# Patient Record
Sex: Female | Born: 2000 | Race: Black or African American | Hispanic: No | Marital: Single | Smoking: Never smoker
Health system: Southern US, Community
[De-identification: ages and names within clinical notes are randomized; demographics above are authoritative.]

## PROBLEM LIST (undated history)

## (undated) DIAGNOSIS — M1990 Unspecified osteoarthritis, unspecified site: Secondary | ICD-10-CM

---
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
## 2020-04-14 ENCOUNTER — Other Ambulatory Visit: Payer: Self-pay

## 2020-04-14 ENCOUNTER — Ambulatory Visit
Admission: EM | Admit: 2020-04-14 | Discharge: 2020-04-14 | Disposition: A | Discharge status: Home / Self Care | Payer: Medicaid Other | Attending: Emergency Medicine | Admitting: Emergency Medicine

## 2020-04-14 ENCOUNTER — Encounter: Payer: Self-pay | Admitting: Emergency Medicine

## 2020-04-14 DIAGNOSIS — N3001 Acute cystitis with hematuria: Secondary | ICD-10-CM | POA: Diagnosis not present

## 2020-04-14 LAB — POCT URINALYSIS DIP (MANUAL ENTRY)
Bilirubin, UA: NEGATIVE
Glucose, UA: NEGATIVE mg/dL
Ketones, POC UA: NEGATIVE mg/dL
Nitrite, UA: NEGATIVE
Protein Ur, POC: 100 mg/dL — AB
Spec Grav, UA: >=1.03 — AB (ref 1.010–1.025)
Urobilinogen, UA: 1 E.U./dL
pH, UA: 6 (ref 5.0–8.0)

## 2020-04-14 LAB — POCT URINE PREGNANCY: Preg Test, Ur: NEGATIVE

## 2020-04-14 MED ORDER — CEPHALEXIN 500 MG PO CAPS: 500.0000 mg | ORAL_CAPSULE | Freq: Two times a day (BID) | ORAL | 0 refills | Status: AC

## 2020-04-14 NOTE — Discharge Instructions (Addendum)
Take antibiotic twice daily with food. Important to drink plenty of water throughout the day. Return for worsening urinary symptoms, blood in urine, abdominal or back pain, fever. 

## 2020-04-14 NOTE — ED Provider Notes (Signed)
[facility]    CSN: [id number] Arrival date & time: 04/14/20  1425      History   Chief Complaint Chief Complaint  Patient presents with  . Dysuria    HPI Dana Marquez is a 19 y.o. female  Presenting for possible UTI.  Noting burning sensation with malodor when urinating for the last 3 days.  Denies abdominal or back pain, fever.  No vaginal discharge, pelvic pain, concern for STI.  Denies any genital rash, change in bowel movement.  Not tried thing for this.  History reviewed. No pertinent past medical history.  There are no problems to display for this patient.   Past Surgical History:  Procedure Laterality Date  . TONSILLECTOMY  2012    OB History    Gravida  0   Para  0   Term  0   Preterm  0   AB  0   Living  0     SAB  0   TAB  0   Ectopic  0   Multiple  0   Live Births  0            Home Medications    Prior to Admission medications   Medication Sig Start Date End Date Taking? Authorizing Provider  acetaminophen-codeine 120-12 MG/5ML solution Take 5 mLs by mouth every 4 (four) hours as needed. For pain    [provider]  cephALEXin (KEFLEX) 500 MG capsule Take 1 capsule (500 mg total) by mouth 2 (two) times daily for 3 days. 04/14/20 04/17/20  Hall-Potvin, [place], PA-C  guaiFENesin (MUCINEX) 600 MG 12 hr tablet Take 1 tablet (600 mg total) by mouth 2 (two) times daily. Patient not taking: Reported on 09/15/2018 05/15/18   Garlon Hatchet, PA-C  ibuprofen (ADVIL) 800 MG tablet Take 1 tablet (800 mg total) by mouth every 8 (eight) hours as needed. 05/23/19   Brock Bad, MD  lidocaine (XYLOCAINE) 2 % solution Use as directed 15 mLs in the mouth or throat as needed for mouth pain. Patient not taking: Reported on 09/15/2018 05/15/18   Garlon Hatchet, PA-C  medroxyPROGESTERone (DEPO-PROVERA) 150 MG/ML injection Inject 1 mL (150 mg total) into the muscle every 3 (three) months. 05/23/19   Brock Bad, MD   polyethylene glycol powder (GLYCOLAX/MIRALAX) powder 1 capful mixed in 6 oz pear juice once daily for 1 week Patient not taking: Reported on 06/04/2017 03/01/12   Ree Shay, MD  Prenat w/o A-FeCbn-Meth-FA-DHA (PRENATE MINI) 29-0.6-0.4-350 MG CAPS Take 1 capsule by mouth daily before breakfast. 05/23/19   Brock Bad, MD  promethazine (PHENERGAN) 25 MG tablet Take 1 tablet (25 mg total) by mouth every 6 (six) hours as needed for nausea or vomiting. Patient not taking: Reported on 06/04/2017 12/31/15   Linna Hoff, MD  tinidazole [facility]) 500 MG tablet Take 2 tablets (1,000 mg total) by mouth daily with breakfast. 05/25/19   Brock Bad, MD    Family History Family History  Problem Relation Age of Onset  . Diabetes type II Maternal Grandmother   . Hypertension Maternal Grandmother     Social History Social History   Tobacco Use  . Smoking status: Never Smoker  . Smokeless tobacco: Never Used  Vaping Use  . Vaping Use: Never used  Substance Use Topics  . Alcohol use: No  . Drug use: No     Allergies   Patient has no known allergies.   Review of Systems As  per HPI   Physical Exam Triage Vital Signs ED Triage Vitals [04/14/20 1449]  Enc Vitals Group     BP 133/90     Pulse Rate 88     Resp 18     Temp 98.6 F (37 C)     Temp Source Oral     SpO2 98 %     Weight      Height      Head Circumference      Peak Flow      Pain Score 4     Pain Loc      Pain Edu?      Excl. in GC?    No data found.  Updated Vital Signs BP 133/90 (BP Location: Right Arm)   Pulse 88   Temp 98.6 F (37 C) (Oral)   Resp 18   SpO2 98%   Visual Acuity Right Eye Distance:   Left Eye Distance:   Bilateral Distance:    Right Eye Near:   Left Eye Near:    Bilateral Near:     Physical Exam Constitutional:      General: She is not in acute distress. HENT:     Head: Normocephalic and atraumatic.  Eyes:     General: No scleral icterus.    Pupils: Pupils are  equal, round, and reactive to light.  Cardiovascular:     Rate and Rhythm: Normal rate.  Pulmonary:     Effort: Pulmonary effort is normal.  Abdominal:     General: Bowel sounds are normal.     Palpations: Abdomen is soft.     Tenderness: There is no abdominal tenderness. There is no right CVA tenderness, left CVA tenderness or guarding.  Skin:    Coloration: Skin is not jaundiced or pale.  Neurological:     Mental Status: She is alert and oriented to person, place, and time.      UC Treatments / Results  Labs (all labs ordered are listed, but only abnormal results are displayed) Labs Reviewed  POCT URINALYSIS DIP (MANUAL ENTRY) - Abnormal; Notable for the following components:      Result Value   Clarity, UA cloudy (*)    Spec Grav, UA >=1.030 (*)    Blood, UA small (*)    Protein Ur, POC =100 (*)    Leukocytes, UA Trace (*)    All other components within normal limits  URINE CULTURE  POCT URINE PREGNANCY    EKG   Radiology No results found.  Procedures Procedures (including critical care time)  Medications Ordered in UC Medications - No data to display  Initial Impression / Assessment and Plan / UC Course  I have reviewed the triage vital signs and the nursing notes.  Pertinent labs & imaging results that were available during my care of the patient were reviewed by me and considered in my medical decision making (see chart for details).     Urine negative, urine dip with abnormalities as above.  Will start antibiotics as below for UTI.  Return precautions discussed, pt verbalized understanding and is agreeable to plan. Final Clinical Impressions(s) / UC Diagnoses   Final diagnoses:  Acute cystitis with hematuria     Discharge Instructions     Take antibiotic twice daily with food. Important to drink plenty of water throughout the day. Return for worsening urinary symptoms, blood in urine, abdominal or back pain, fever.    ED Prescriptions     Medication Sig Dispense Auth. Provider  cephALEXin (KEFLEX) 500 MG capsule Take 1 capsule (500 mg total) by mouth 2 (two) times daily for 3 days. 6 capsule Hall-Potvin, [place], PA-C     PDMP not reviewed this encounter.   Hall-Potvin, [place], [place] 04/14/20 1536

## 2020-04-14 NOTE — ED Triage Notes (Signed)
Pt here for dysuria and foul odor x 3 days

## 2020-04-20 LAB — URINE CULTURE: Culture: >=100000 — AB

## 2020-05-24 ENCOUNTER — Other Ambulatory Visit: Payer: Self-pay

## 2020-05-24 ENCOUNTER — Encounter: Payer: Self-pay | Admitting: Obstetrics

## 2020-05-24 ENCOUNTER — Ambulatory Visit (INDEPENDENT_AMBULATORY_CARE_PROVIDER_SITE_OTHER): Payer: Medicaid Other | Admitting: Obstetrics

## 2020-05-24 ENCOUNTER — Other Ambulatory Visit (HOSPITAL_COMMUNITY)
Admission: RE | Admit: 2020-05-24 | Discharge: 2020-05-24 | Disposition: A | Discharge status: Home / Self Care | Payer: Medicaid Other | Source: Ambulatory Visit | Attending: Obstetrics | Admitting: Obstetrics

## 2020-05-24 VITALS — BP 108/70 | HR 58 | Ht 63.0 in | Wt 100.0 lb

## 2020-05-24 DIAGNOSIS — N898 Other specified noninflammatory disorders of vagina: Secondary | ICD-10-CM

## 2020-05-24 DIAGNOSIS — Z113 Encounter for screening for infections with a predominantly sexual mode of transmission: Secondary | ICD-10-CM

## 2020-05-24 DIAGNOSIS — Z3042 Encounter for surveillance of injectable contraceptive: Secondary | ICD-10-CM

## 2020-05-24 LAB — POCT URINE PREGNANCY: Preg Test, Ur: NEGATIVE

## 2020-05-24 MED ORDER — MEDROXYPROGESTERONE ACETATE 150 MG/ML IM SUSP: 150.0000 mg | INTRAMUSCULAR | 3 refills | Status: DC

## 2020-05-24 NOTE — Progress Notes (Signed)
GYN presents for AEX/STD screening.  Patient wants to restart DEPO for [facility]. Last unproctected sex was last week.  Reports no problems today.  UPT today is NEGATIVE

## 2020-05-24 NOTE — Progress Notes (Signed)
Subjective:        Dana Marquez is a 19 y.o. female here for a routine exam.  Current complaints: Vaginal discharge.    Personal health questionnaire:  Is patient [name], have a family history of breast and/or ovarian cancer: no Is there a family history of uterine cancer diagnosed at age < 62, gastrointestinal cancer, urinary tract cancer, family member who is a Personnel officer syndrome-associated carrier: no Is the patient overweight and hypertensive, family history of diabetes, personal history of gestational diabetes, preeclampsia or PCOS: no Is patient over 77, have PCOS,  family history of premature CHD under age 68, diabetes, smoke, have hypertension or peripheral artery disease:  no At any time, has a partner hit, kicked or otherwise hurt or frightened you?: no Over the past 2 weeks, have you felt down, depressed or hopeless?: no Over the past 2 weeks, have you felt little interest or pleasure in doing things?:no   Gynecologic History Patient's last menstrual period was 05/10/2020 (exact date). Contraception: Depo-Provera injections Last Pap: n/a. Results were: n/a Last mammogram: n/a. Results were: n/a  Obstetric History OB History  Gravida Para Term Preterm AB Living  0 0 0 0 0 0  SAB TAB Ectopic Multiple Live Births  0 0 0 0 0    History reviewed. No pertinent past medical history.  Past Surgical History:  Procedure Laterality Date  . TONSILLECTOMY  2012     Current Outpatient Medications:  .  acetaminophen-codeine 120-12 MG/5ML solution, Take 5 mLs by mouth every 4 (four) hours as needed. For pain, Disp: , Rfl:  .  guaiFENesin (MUCINEX) 600 MG 12 hr tablet, Take 1 tablet (600 mg total) by mouth 2 (two) times daily. (Patient not taking: Reported on 09/15/2018), Disp: 30 tablet, Rfl: 0 .  ibuprofen (ADVIL) 800 MG tablet, Take 1 tablet (800 mg total) by mouth every 8 (eight) hours as needed., Disp: 30 tablet, Rfl: 5 .  lidocaine (XYLOCAINE) 2 % solution, Use  as directed 15 mLs in the mouth or throat as needed for mouth pain. (Patient not taking: Reported on 09/15/2018), Disp: 150 mL, Rfl: 0 .  medroxyPROGESTERone (DEPO-PROVERA) 150 MG/ML injection, Inject 1 mL (150 mg total) into the muscle every 3 (three) months., Disp: 1 mL, Rfl: 3 .  polyethylene glycol powder (GLYCOLAX/MIRALAX) powder, 1 capful mixed in 6 oz pear juice once daily for 1 week (Patient not taking: Reported on 06/04/2017), Disp: 255 g, Rfl: 0 .  Prenat w/o A-FeCbn-Meth-FA-DHA (PRENATE MINI) 29-0.6-0.4-350 MG CAPS, Take 1 capsule by mouth daily before breakfast., Disp: 30 capsule, Rfl: 11 .  promethazine (PHENERGAN) 25 MG tablet, Take 1 tablet (25 mg total) by mouth every 6 (six) hours as needed for nausea or vomiting. (Patient not taking: Reported on 06/04/2017), Disp: 10 tablet, Rfl: 0 .  tinidazole (TINDAMAX) 500 MG tablet, Take 2 tablets (1,000 mg total) by mouth daily with breakfast., Disp: 10 tablet, Rfl: 2  Current Facility-Administered Medications:  .  medroxyPROGESTERone (DEPO-PROVERA) injection 150 mg, 150 mg, Intramuscular, Q90 days, Burleson, Terri L, NP, 150 mg at 11/18/17 0830 .  medroxyPROGESTERone (DEPO-PROVERA) injection 150 mg, 150 mg, Intramuscular, Q90 days, Burleson, Terri L, NP, 150 mg at 06/21/18 1106 No Known Allergies  Social History   Tobacco Use  . Smoking status: Never Smoker  . Smokeless tobacco: Never Used  Substance Use Topics  . Alcohol use: No    Family History  Problem Relation Age of Onset  . Diabetes type II Maternal Grandmother   .  Hypertension Maternal Grandmother       Review of Systems  Constitutional: negative for fatigue and weight loss Respiratory: negative for cough and wheezing Cardiovascular: negative for chest pain, fatigue and palpitations Gastrointestinal: negative for abdominal pain and change in bowel habits Musculoskeletal:negative for myalgias Neurological: negative for gait problems and tremors Behavioral/Psych:  negative for abusive relationship, depression Endocrine: negative for temperature intolerance    Genitourinary:negative for abnormal menstrual periods, genital lesions, hot flashes, sexual problems.  Positive for vaginal discharge Integument/breast: negative for breast lump, breast tenderness, nipple discharge and skin lesion(s)    Objective:       BP 108/70   Pulse (!) 58   Ht 5\' 3"  (1.6 m)   Wt 100 lb (45.4 kg)   LMP 05/10/2020 (Exact Date)   BMI 17.71 kg/m  General:   alert and no distress  Skin:   no rash or abnormalities  Lungs:   clear to auscultation bilaterally  Heart:   regular rate and rhythm, S1, S2 normal, no murmur, click, rub or gallop  Breasts:   normal without suspicious masses, skin or nipple changes or axillary nodes  Abdomen:  normal findings: no organomegaly, soft, non-tender and no hernia  Pelvis:  External genitalia: normal general appearance Urinary system: urethral meatus normal and bladder without fullness, nontender Vaginal: normal without tenderness, induration or masses Cervix: normal appearance Adnexa: normal bimanual exam Uterus: anteverted and non-tender, normal size   Lab Review Urine pregnancy test:  Negative Labs reviewed yes Radiologic studies reviewed no  50% of 20 min visit spent on counseling and coordination of care.   Assessment:     1. Encounter for surveillance of injectable contraceptive Rx: - medroxyPROGESTERone (DEPO-PROVERA) 150 MG/ML injection; Inject 1 mL (150 mg total) into the muscle every 3 (three) months.  Dispense: 1 mL; Refill: 3 - POCT urine pregnancy  2. Vaginal discharge Rx: - Cervicovaginal ancillary only( [place])  3. Screening for STD (sexually transmitted disease) Rx: - Hepatitis C Antibody - Hepatitis B surface antigen - HIV antibody (with reflex) - RPR    Plan:    Education reviewed: calcium supplements, depression evaluation, low fat, low cholesterol diet, safe sex/STD prevention, self breast  exams and weight bearing exercise. Contraception: Depo-Provera injections. Follow up in: 1 year.   Meds ordered this encounter  Medications  . medroxyPROGESTERone (DEPO-PROVERA) 150 MG/ML injection    Sig: Inject 1 mL (150 mg total) into the muscle every 3 (three) months.    Dispense:  1 mL    Refill:  3   Orders Placed This Encounter  Procedures  . Hepatitis C Antibody  . Hepatitis B surface antigen  . HIV antibody (with reflex)  . RPR  . POCT urine pregnancy    05/12/2020, MD 05/24/2020 9:53 AM

## 2020-05-25 LAB — HEPATITIS C ANTIBODY: Hep C Virus Ab: <0.1 s/co ratio (ref 0.0–0.9)

## 2020-05-25 LAB — HEPATITIS B SURFACE ANTIGEN: Hepatitis B Surface Ag: NEGATIVE

## 2020-05-25 LAB — RPR: RPR Ser Ql: NONREACTIVE

## 2020-05-25 LAB — HIV ANTIBODY (ROUTINE TESTING W REFLEX): HIV Screen 4th Generation wRfx: NONREACTIVE

## 2020-05-27 LAB — CERVICOVAGINAL ANCILLARY ONLY
Bacterial Vaginitis (gardnerella): NEGATIVE
Candida Glabrata: NEGATIVE
Candida Vaginitis: NEGATIVE
Chlamydia: NEGATIVE
Comment: NEGATIVE
Comment: NEGATIVE
Comment: NEGATIVE
Comment: NEGATIVE
Comment: NEGATIVE
Comment: NORMAL
Neisseria Gonorrhea: NEGATIVE
Trichomonas: NEGATIVE

## 2020-06-07 ENCOUNTER — Ambulatory Visit (INDEPENDENT_AMBULATORY_CARE_PROVIDER_SITE_OTHER): Payer: Medicaid Other

## 2020-06-07 ENCOUNTER — Other Ambulatory Visit: Payer: Self-pay

## 2020-06-07 DIAGNOSIS — Z3042 Encounter for surveillance of injectable contraceptive: Secondary | ICD-10-CM | POA: Diagnosis not present

## 2020-06-07 LAB — POCT URINE PREGNANCY: Preg Test, Ur: NEGATIVE

## 2020-06-07 NOTE — Progress Notes (Signed)
Subjective:  Pt in for Depo Provera injection.    Objective: Need for contraception. No unusual complaints.  UPT neg   Assessment: Pt tolerated Depo injection. Depo given R upper outer quadrant.   Plan:  Next injection due Mar 4-17    Administrations This Visit    medroxyPROGESTERone (DEPO-PROVERA) injection 150 mg    Admin Date 06/07/2020 Action Given Dose 150 mg Route Intramuscular Administered By Lewayne Bunting, CMA

## 2020-07-15 ENCOUNTER — Other Ambulatory Visit: Payer: Self-pay | Admitting: Obstetrics

## 2020-07-15 DIAGNOSIS — N946 Dysmenorrhea, unspecified: Secondary | ICD-10-CM

## 2020-08-27 ENCOUNTER — Ambulatory Visit: Payer: Medicaid Other

## 2020-09-03 ENCOUNTER — Ambulatory Visit (INDEPENDENT_AMBULATORY_CARE_PROVIDER_SITE_OTHER): Payer: Medicaid Other

## 2020-09-03 ENCOUNTER — Other Ambulatory Visit: Payer: Self-pay

## 2020-09-03 VITALS — Wt 104.0 lb

## 2020-09-03 DIAGNOSIS — Z3042 Encounter for surveillance of injectable contraceptive: Secondary | ICD-10-CM | POA: Diagnosis not present

## 2020-09-03 MED ORDER — MEDROXYPROGESTERONE ACETATE 150 MG/ML IM SUSP
150.0000 mg | Freq: Once | INTRAMUSCULAR | Status: AC
  Administered 2020-09-03: 150 mg via INTRAMUSCULAR

## 2020-09-03 NOTE — Progress Notes (Signed)
GYN presents for DEPO Injection, given in RUOQ, tolerated well.  Next DEPO due May 31 - December 03, 2020  Administrations This Visit    medroxyPROGESTERone (DEPO-PROVERA) injection 150 mg    Admin Date 09/03/2020 Action Given Dose 150 mg Route Intramuscular Administered By McGlashan, Carol J, RMA         

## 2020-09-03 NOTE — Progress Notes (Signed)
Patient was assessed and managed by nursing staff during this encounter. I have reviewed the chart and agree with the documentation and plan. I have also made any necessary editorial changes.  Warden Fillers, MD 09/03/2020 11:23 AM

## 2020-11-21 ENCOUNTER — Ambulatory Visit (INDEPENDENT_AMBULATORY_CARE_PROVIDER_SITE_OTHER): Payer: Medicaid Other

## 2020-11-21 ENCOUNTER — Other Ambulatory Visit: Payer: Self-pay

## 2020-11-21 VITALS — BP 112/71 | HR 90 | Ht 63.0 in | Wt 104.0 lb

## 2020-11-21 DIAGNOSIS — Z3042 Encounter for surveillance of injectable contraceptive: Secondary | ICD-10-CM

## 2020-11-21 MED ORDER — MEDROXYPROGESTERONE ACETATE 150 MG/ML IM SUSP
150.0000 mg | Freq: Once | INTRAMUSCULAR | Status: AC
  Administered 2020-11-21: 150 mg via INTRAMUSCULAR

## 2020-11-21 NOTE — Progress Notes (Addendum)
Subjective:  Dana Marquez is a 20 y.o. female here for DEPO Injection.  Objective:  BP 112/71   Pulse 90   Ht 5\' 3"  (1.6 m)   Wt 104 lb (47.2 kg)   BMI 18.42 kg/m   Appearance alert, well appearing, and in no distress. General exam BP noted to be well controlled today in office.    Assessment:   Patient is on time for Depo.   Plan:  DEPO Injection given in LUOQ, tolerated well. Next DEPO due Aug. 18 - Sept. 1, 2022  Administrations This Visit    medroxyPROGESTERone (DEPO-PROVERA) injection 150 mg    Admin Date 11/21/2020 Action Given Dose 150 mg Route Intramuscular Administered By 01/21/2021, RMA

## 2020-11-21 NOTE — Progress Notes (Signed)
I have reviewed the chart and agree with the nurse's note and plan of care for this visit.   Sharen Counter, CNM 6:10 PM

## 2021-02-10 ENCOUNTER — Ambulatory Visit: Payer: Medicaid Other

## 2021-02-19 ENCOUNTER — Ambulatory Visit: Payer: Medicaid Other

## 2021-02-19 ENCOUNTER — Other Ambulatory Visit: Payer: Self-pay

## 2021-02-19 ENCOUNTER — Ambulatory Visit (INDEPENDENT_AMBULATORY_CARE_PROVIDER_SITE_OTHER): Payer: Medicaid Other

## 2021-02-19 VITALS — BP 129/72 | HR 79

## 2021-02-19 DIAGNOSIS — Z3042 Encounter for surveillance of injectable contraceptive: Secondary | ICD-10-CM | POA: Diagnosis not present

## 2021-02-19 NOTE — Progress Notes (Signed)
Subjective: Dana Marquez is here for her depo-provera injection.  Objective:129/72 P 79  Assessment: Patient is on time for her depo injection.  Plan: Depo-Provera given in LUOQ,tolerated well.   Next Depo is due 05/09/2021-05/21/2021.  IHK:[id number]

## 2021-02-26 DIAGNOSIS — Z3042 Encounter for surveillance of injectable contraceptive: Secondary | ICD-10-CM | POA: Diagnosis not present

## 2021-03-04 ENCOUNTER — Encounter (HOSPITAL_COMMUNITY): Payer: Self-pay

## 2021-03-04 ENCOUNTER — Ambulatory Visit (HOSPITAL_COMMUNITY)
Admission: EM | Admit: 2021-03-04 | Discharge: 2021-03-04 | Disposition: A | Discharge status: Home / Self Care | Payer: Medicaid Other | Attending: Emergency Medicine | Admitting: Emergency Medicine

## 2021-03-04 ENCOUNTER — Other Ambulatory Visit: Payer: Self-pay

## 2021-03-04 DIAGNOSIS — Z20822 Contact with and (suspected) exposure to covid-19: Secondary | ICD-10-CM | POA: Diagnosis not present

## 2021-03-04 DIAGNOSIS — J029 Acute pharyngitis, unspecified: Secondary | ICD-10-CM | POA: Insufficient documentation

## 2021-03-04 LAB — SARS CORONAVIRUS 2 (TAT 6-24 HRS): SARS Coronavirus 2: NEGATIVE

## 2021-03-04 LAB — POCT RAPID STREP A, ED / UC: Streptococcus, Group A Screen (Direct): NEGATIVE

## 2021-03-04 NOTE — ED Triage Notes (Signed)
Called in lobby no answered.  

## 2021-03-04 NOTE — Discharge Instructions (Signed)

## 2021-03-04 NOTE — ED Provider Notes (Signed)
[facility]    CSN: [id number] Arrival date & time: 03/04/21  1449      History   Chief Complaint Chief Complaint  Patient presents with   Sore Throat    HPI Dana Marquez is a 20 y.o. female.   Patient for evaluation of sore throat that started last night.  Reports first noticed throat pain last night and then this morning reports that she noticed it was red and swollen.  Denies any fevers.  Denies any recent sick contacts.  Has not taken any OTC medications or treatments.  Denies any trauma, injury, or other precipitating event.  Denies any specific alleviating or aggravating factors.  Denies any fevers, chest pain, shortness of breath, N/V/D, numbness, tingling, weakness, abdominal pain, or headaches.    The history is provided by the patient.  Sore Throat   History reviewed. No pertinent past medical history.  There are no problems to display for this patient.   Past Surgical History:  Procedure Laterality Date   TONSILLECTOMY  2012    OB History     Gravida  0   Para  0   Term  0   Preterm  0   AB  0   Living  0      SAB  0   IAB  0   Ectopic  0   Multiple  0   Live Births  0            Home Medications    Prior to Admission medications   Medication Sig Start Date End Date Taking? Authorizing Provider  acetaminophen-codeine 120-12 MG/5ML solution Take 5 mLs by mouth every 4 (four) hours as needed. For pain    [provider]  guaiFENesin (MUCINEX) 600 MG 12 hr tablet Take 1 tablet (600 mg total) by mouth 2 (two) times daily. Patient not taking: Reported on 09/15/2018 05/15/18   Garlon Hatchet, PA-C  ibuprofen (ADVIL) 800 MG tablet TAKE 1 TABLET BY MOUTH EVERY 8 HOURS AS NEEDED 07/16/20   Brock Bad, MD  lidocaine (XYLOCAINE) 2 % solution Use as directed 15 mLs in the mouth or throat as needed for mouth pain. Patient not taking: Reported on 09/15/2018 05/15/18   Garlon Hatchet, PA-C  medroxyPROGESTERone  (DEPO-PROVERA) 150 MG/ML injection Inject 1 mL (150 mg total) into the muscle every 3 (three) months. 05/24/20   Brock Bad, MD  polyethylene glycol powder (GLYCOLAX/MIRALAX) powder 1 capful mixed in 6 oz pear juice once daily for 1 week Patient not taking: Reported on 06/04/2017 03/01/12   Ree Shay, MD  Prenat w/o A-FeCbn-Meth-FA-DHA (PRENATE MINI) 29-0.6-0.4-350 MG CAPS Take 1 capsule by mouth daily before breakfast. 05/23/19   Brock Bad, MD  promethazine (PHENERGAN) 25 MG tablet Take 1 tablet (25 mg total) by mouth every 6 (six) hours as needed for nausea or vomiting. Patient not taking: Reported on 06/04/2017 12/31/15   Linna Hoff, MD  tinidazole [facility]) 500 MG tablet Take 2 tablets (1,000 mg total) by mouth daily with breakfast. 05/25/19   Brock Bad, MD    Family History Family History  Problem Relation Age of Onset   Diabetes type II Maternal Grandmother    Hypertension Maternal Grandmother     Social History Social History   Tobacco Use   Smoking status: Never   Smokeless tobacco: Never  Vaping Use   Vaping Use: Never used  Substance Use Topics   Alcohol use: No  Drug use: No     Allergies   Patient has no known allergies.   Review of Systems Review of Systems  Constitutional:  Negative for fever.  HENT:  Positive for sore throat and trouble swallowing. Negative for congestion.   Respiratory:  Negative for cough.   All other systems reviewed and are negative.   Physical Exam Triage Vital Signs ED Triage Vitals  Enc Vitals Group     BP 03/04/21 1544 116/78     Pulse Rate 03/04/21 1544 76     Resp 03/04/21 1544 16     Temp 03/04/21 1544 98.7 F (37.1 C)     Temp Source 03/04/21 1544 Oral     SpO2 03/04/21 1544 98 %     Weight --      Height --      Head Circumference --      Peak Flow --      Pain Score 03/04/21 1549 8     Pain Loc --      Pain Edu? --      Excl. in GC? --    No data found.  Updated Vital Signs BP  116/78 (BP Location: Right Arm)   Pulse 76   Temp 98.7 F (37.1 C) (Oral)   Resp 16   SpO2 98%   Visual Acuity Right Eye Distance:   Left Eye Distance:   Bilateral Distance:    Right Eye Near:   Left Eye Near:    Bilateral Near:     Physical Exam Vitals and nursing note reviewed.  Constitutional:      General: She is not in acute distress.    Appearance: Normal appearance. She is not ill-appearing, toxic-appearing or diaphoretic.  HENT:     Head: Normocephalic and atraumatic.     Mouth/Throat:     Pharynx: Uvula midline. Posterior oropharyngeal erythema present. No pharyngeal swelling.     Comments: Tonsils surgically removed Eyes:     Conjunctiva/sclera: Conjunctivae normal.  Cardiovascular:     Rate and Rhythm: Normal rate.     Pulses: Normal pulses.  Pulmonary:     Effort: Pulmonary effort is normal.  Abdominal:     General: Abdomen is flat.  Musculoskeletal:        General: Normal range of motion.     Cervical back: Normal range of motion.  Skin:    General: Skin is warm and dry.  Neurological:     General: No focal deficit present.     Mental Status: She is alert and oriented to person, place, and time.  Psychiatric:        Mood and Affect: Mood normal.     UC Treatments / Results  Labs (all labs ordered are listed, but only abnormal results are displayed) Labs Reviewed  SARS CORONAVIRUS 2 (TAT 6-24 HRS)  CULTURE, GROUP A STREP [facility])  POCT RAPID STREP A, ED / UC    EKG   Radiology No results found.  Procedures Procedures (including critical care time)  Medications Ordered in UC Medications - No data to display  Initial Impression / Assessment and Plan / UC Course  I have reviewed the triage vital signs and the nursing notes.  Pertinent labs & imaging results that were available during my care of the patient were reviewed by me and considered in my medical decision making (see chart for details).    Assessment negative for red flags or  concerns.  Likely viral pharyngitis.  Rapid strep negative, throat  culture pending.  COVID test pending.  Discussed conservative symptom management as described in discharge instructions.  Encourage fluids and rest.  Tylenol and/or ibuprofen as needed.  Follow-up as needed Final Clinical Impressions(s) / UC Diagnoses   Final diagnoses:  Viral pharyngitis     Discharge Instructions      We will contact you if your COVID test is positive.  Please quarantine while you wait for the results.  If your test is negative you may resume normal activities.  If your test is positive please continue to quarantine for at least 5 days from your symptom onset or until you are without a fever for at least 24 hours after the medications.  You can take Tylenol and/or Ibuprofen as needed for fever reduction and pain relief.   For cough: honey 1/2 to 1 teaspoon (you can dilute the honey in water or another fluid).  You can also use guaifenesin and dextromethorphan for cough. You can use a humidifier for chest congestion and cough.  If you don't have a humidifier, you can sit in the bathroom with the hot shower running.     For sore throat: try warm salt water gargles, cepacol lozenges, throat spray, warm tea or water with lemon/honey, popsicles or ice, or OTC cold relief medicine for throat discomfort.    For congestion: take a daily anti-histamine like Zyrtec, Claritin, and a oral decongestant, such as pseudoephedrine.  You can also use Flonase 1-2 sprays in each nostril daily.    It is important to stay hydrated: drink plenty of fluids (water, gatorade/powerade/pedialyte, juices, or teas) to keep your throat moisturized and help further relieve irritation/discomfort.   Return or go to the Emergency Department if symptoms worsen or do not improve in the next few days.      ED Prescriptions   None    PDMP not reviewed this encounter.   Ivette Loyal, NP 03/04/21 [phone]

## 2021-03-04 NOTE — ED Triage Notes (Signed)
Pt in with c/o st that started over night  States when she woke up today her throat was red and swollen

## 2021-03-07 LAB — CULTURE, GROUP A STREP (THRC)

## 2021-05-12 ENCOUNTER — Ambulatory Visit: Payer: Medicaid Other

## 2021-06-21 ENCOUNTER — Emergency Department (HOSPITAL_COMMUNITY)
Admission: EM | Admit: 2021-06-21 | Discharge: 2021-06-21 | Disposition: A | Discharge status: Home / Self Care | Payer: Medicaid Other | Attending: Emergency Medicine | Admitting: Emergency Medicine

## 2021-06-21 ENCOUNTER — Other Ambulatory Visit: Payer: Self-pay

## 2021-06-21 ENCOUNTER — Encounter (HOSPITAL_COMMUNITY): Payer: Self-pay | Admitting: Emergency Medicine

## 2021-06-21 DIAGNOSIS — S50811A Abrasion of right forearm, initial encounter: Secondary | ICD-10-CM | POA: Insufficient documentation

## 2021-06-21 DIAGNOSIS — S59911A Unspecified injury of right forearm, initial encounter: Secondary | ICD-10-CM | POA: Diagnosis present

## 2021-06-21 DIAGNOSIS — Y990 Civilian activity done for income or pay: Secondary | ICD-10-CM | POA: Insufficient documentation

## 2021-06-21 DIAGNOSIS — T148XXA Other injury of unspecified body region, initial encounter: Secondary | ICD-10-CM

## 2021-06-21 DIAGNOSIS — X58XXXA Exposure to other specified factors, initial encounter: Secondary | ICD-10-CM | POA: Diagnosis not present

## 2021-06-21 NOTE — Discharge Instructions (Signed)
Apply neosporin or mupirocin cream to the abrasion. Follow up with primary if needed, information above

## 2021-06-21 NOTE — ED Provider Notes (Signed)
[facility]-EMERGENCY DEPT Provider Note   CSN: [id number] Arrival date & time: 06/21/21  0756     History Chief Complaint  Patient presents with   Abrasion    Dana Marquez is a 20 y.o. female.  HPI  Patient presents with laceration to the right anterior forearm.  This happened acutely the wall at work, she was scratched by resident.  She works as a Lawyer.  Denies any fevers, chills.  Unsure when her last tetanus was but she did start college 2 years ago and was up-to-date on vaccinations at that time.  She currently does not have a primary care doctor, has not been followed since aging out of pediatrics.  She denies any nausea or vomiting, has not tried anything.  The pain is a 1 out of 10, no surrounding erythema or exudate.  History reviewed. No pertinent past medical history.  There are no problems to display for this patient.   Past Surgical History:  Procedure Laterality Date   TONSILLECTOMY  2012     OB History     Gravida  0   Para  0   Term  0   Preterm  0   AB  0   Living  0      SAB  0   IAB  0   Ectopic  0   Multiple  0   Live Births  0           Family History  Problem Relation Age of Onset   Diabetes type II Maternal Grandmother    Hypertension Maternal Grandmother     Social History   Tobacco Use   Smoking status: Never   Smokeless tobacco: Never  Vaping Use   Vaping Use: Never used  Substance Use Topics   Alcohol use: No   Drug use: No    Home Medications Prior to Admission medications   Medication Sig Start Date End Date Taking? Authorizing Provider  acetaminophen-codeine 120-12 MG/5ML solution Take 5 mLs by mouth every 4 (four) hours as needed. For pain    [provider]  guaiFENesin (MUCINEX) 600 MG 12 hr tablet Take 1 tablet (600 mg total) by mouth 2 (two) times daily. Patient not taking: Reported on 09/15/2018 05/15/18   Garlon Hatchet, PA-C  ibuprofen (ADVIL) 800 MG tablet TAKE  1 TABLET BY MOUTH EVERY 8 HOURS AS NEEDED 07/16/20   Brock Bad, MD  lidocaine (XYLOCAINE) 2 % solution Use as directed 15 mLs in the mouth or throat as needed for mouth pain. Patient not taking: Reported on 09/15/2018 05/15/18   Garlon Hatchet, PA-C  medroxyPROGESTERone (DEPO-PROVERA) 150 MG/ML injection Inject 1 mL (150 mg total) into the muscle every 3 (three) months. 05/24/20   Brock Bad, MD  polyethylene glycol powder (GLYCOLAX/MIRALAX) powder 1 capful mixed in 6 oz pear juice once daily for 1 week Patient not taking: Reported on 06/04/2017 03/01/12   Ree Shay, MD  Prenat w/o A-FeCbn-Meth-FA-DHA (PRENATE MINI) 29-0.6-0.4-350 MG CAPS Take 1 capsule by mouth daily before breakfast. 05/23/19   Brock Bad, MD  promethazine (PHENERGAN) 25 MG tablet Take 1 tablet (25 mg total) by mouth every 6 (six) hours as needed for nausea or vomiting. Patient not taking: Reported on 06/04/2017 12/31/15   Linna Hoff, MD  tinidazole [facility]) 500 MG tablet Take 2 tablets (1,000 mg total) by mouth daily with breakfast. 05/25/19   Brock Bad, MD    Allergies  Patient has no known allergies.  Review of Systems   Review of Systems  Constitutional:  Negative for fever.  Skin:  Positive for wound.   Physical Exam Updated Vital Signs BP (!) 131/56 (BP Location: Left Arm)    Pulse 82    Temp 98.3 F (36.8 C) (Oral)    Resp 18    SpO2 98%   Physical Exam Vitals and nursing note reviewed. Exam conducted with a chaperone present.  Constitutional:      General: She is not in acute distress.    Appearance: Normal appearance.  HENT:     Head: Normocephalic and atraumatic.  Eyes:     General: No scleral icterus.    Extraocular Movements: Extraocular movements intact.     Pupils: Pupils are equal, round, and reactive to light.  Skin:    Coloration: Skin is not jaundiced.     Comments: Superficial abrasion noted to the right anterior forearm, no surrounding erythema.  No  exudate, no bleeding  Neurological:     Mental Status: She is alert. Mental status is at baseline.     Coordination: Coordination normal.    ED Results / Procedures / Treatments   Labs (all labs ordered are listed, but only abnormal results are displayed) Labs Reviewed - No data to display  EKG None  Radiology No results found.  Procedures Procedures   Medications Ordered in ED Medications - No data to display  ED Course  I have reviewed the triage vital signs and the nursing notes.  Pertinent labs & imaging results that were available during my care of the patient were reviewed by me and considered in my medical decision making (see chart for details).    MDM Rules/Calculators/A&P                         This is a 20 year old female presenting due to abrasion to the right arm.  Stable vitals, patient nontoxic-appearing.  She is not immunocompromise, she has a slight abrasion without any surrounding erythema concerning for cellulitis or infection.  She is not immunocompromise, do not think she would benefit from systemic antibiotics.  Advised topical antibiotics and outpatient follow-up with PCP as needed.  Do not think change additional work-up at this time.  Patient discharged in stable condition.     Final Clinical Impression(s) / ED Diagnoses Final diagnoses:  Abrasion    Rx / DC Orders ED Discharge Orders     None        Theron Arista, Cordelia Poche 06/21/21 0825    Lorre Nick, MD 06/21/21 [phone]

## 2021-06-21 NOTE — ED Triage Notes (Signed)
Pt reports being scratched by resident at work this morning. Scratch on right forearm. Pt does not know when last tetanus shot was. Reports she just wants to be checked out for it.

## 2021-08-16 ENCOUNTER — Ambulatory Visit (HOSPITAL_COMMUNITY)
Admission: EM | Admit: 2021-08-16 | Discharge: 2021-08-16 | Disposition: A | Discharge status: Home / Self Care | Payer: Medicaid Other | Attending: Family Medicine | Admitting: Family Medicine

## 2021-08-16 ENCOUNTER — Other Ambulatory Visit: Payer: Self-pay

## 2021-08-16 ENCOUNTER — Encounter (HOSPITAL_COMMUNITY): Payer: Self-pay

## 2021-08-16 DIAGNOSIS — R509 Fever, unspecified: Secondary | ICD-10-CM

## 2021-08-16 DIAGNOSIS — R051 Acute cough: Secondary | ICD-10-CM | POA: Diagnosis not present

## 2021-08-16 DIAGNOSIS — R110 Nausea: Secondary | ICD-10-CM | POA: Diagnosis not present

## 2021-08-16 LAB — POCT URINALYSIS DIPSTICK, ED / UC
Glucose, UA: NEGATIVE mg/dL
Hgb urine dipstick: NEGATIVE
Ketones, ur: >=160 mg/dL — AB
Leukocytes,Ua: NEGATIVE
Nitrite: NEGATIVE
Protein, ur: 30 mg/dL — AB
Specific Gravity, Urine: >=1.03 (ref 1.005–1.030)
Urobilinogen, UA: 2 mg/dL — ABNORMAL HIGH (ref 0.0–1.0)
pH: 6 (ref 5.0–8.0)

## 2021-08-16 MED ORDER — PROMETHAZINE-DM 6.25-15 MG/5ML PO SYRP: 5.0000 mL | ORAL_SOLUTION | Freq: Four times a day (QID) | ORAL | 0 refills | Status: DC | PRN

## 2021-08-16 MED ORDER — ONDANSETRON 4 MG PO TBDP: 4.0000 mg | ORAL_TABLET | Freq: Three times a day (TID) | ORAL | 0 refills | Status: DC | PRN

## 2021-08-16 NOTE — ED Triage Notes (Signed)
Pt states on Thursday she began having body aches. Pt reports she has had vomiting, cough with yellowish sputum, low back pain since Friday. Pt has taken Tylenol and Ibuprofen for sx. Tylenol last dose noon today and Ibuprofen 0300.

## 2021-08-18 NOTE — ED Provider Notes (Signed)
[facility]   [id number] 08/16/21 Arrival Time: 1652  ASSESSMENT & PLAN:  1. Fever, unspecified fever cause   2. Acute cough   3. Nausea    Labs Reviewed  POCT URINALYSIS DIPSTICK, ED / UC - Abnormal; Notable for the following components:      Result Value   Bilirubin Urine SMALL (*)    Ketones, ur >=160 (*)    Protein, ur 30 (*)    Urobilinogen, UA 2.0 (*)    All other components within normal limits   Begin: Meds ordered this encounter  Medications   ondansetron (ZOFRAN-ODT) 4 MG disintegrating tablet    Sig: Take 1 tablet (4 mg total) by mouth every 8 (eight) hours as needed for nausea or vomiting.    Dispense:  15 tablet    Refill:  0   promethazine-dextromethorphan (PROMETHAZINE-DM) 6.25-15 MG/5ML syrup    Sig: Take 5 mLs by mouth 4 (four) times daily as needed for cough.    Dispense:  118 mL    Refill:  0   Discussed typical duration of symptoms for suspected viral illness. Will do her best to ensure adequate fluid intake in order to avoid dehydration. Will proceed to the Emergency Department for evaluation if unable to tolerate PO fluids regularly.  Otherwise she will f/u with her PCP or here if not showing improvement over the next 48-72 hours.  Reviewed expectations re: course of current medical issues. Questions answered. Outlined signs and symptoms indicating need for more acute intervention. Patient verbalized understanding. After Visit Summary given.   SUBJECTIVE: History from: patient.  Dana Marquez is a 21 y.o. female who presents with complaint of body aches, n/v that is non-bilious, non-bloody. Slight loose stool. Fatigued. Abrupt onset over past 24 hours. No urinary symptoms. Decreased PO intake. Ibuprofen without much relief. Does not feel she has run fever.  Past Surgical History:  Procedure Laterality Date   TONSILLECTOMY  2012    OBJECTIVE:  Vitals:   08/16/21 1808  BP: 120/74  Resp: 18  Temp: (!) 101.4 F (38.6 C)   TempSrc: Oral  SpO2: 100%    Temp noted.  General appearance: alert; no distress but appears fatigued Oropharynx: dry Lungs: clear to auscultation bilaterally; unlabored Heart: regular rate and rhythm Abdomen: soft; non-distended; no significant abdominal tenderness; no masses or organomegaly; no guarding or rebound tenderness Back: no CVA tenderness Extremities: no edema; symmetrical with no gross deformities Skin: warm; dry Neurologic: normal gait Psychological: alert and cooperative; normal mood and affect  Labs: Results for orders placed or performed during the hospital encounter of 08/16/21  POC Urinalysis dipstick  Result Value Ref Range   Glucose, UA NEGATIVE NEGATIVE mg/dL   Bilirubin Urine SMALL (A) NEGATIVE   Ketones, ur >=160 (A) NEGATIVE mg/dL   Specific Gravity, Urine >=1.030 1.005 - 1.030   Hgb urine dipstick NEGATIVE NEGATIVE   pH 6.0 5.0 - 8.0   Protein, ur 30 (A) NEGATIVE mg/dL   Urobilinogen, UA 2.0 (H) 0.0 - 1.0 mg/dL   Nitrite NEGATIVE NEGATIVE   Leukocytes,Ua NEGATIVE NEGATIVE   Labs Reviewed  POCT URINALYSIS DIPSTICK, ED / UC - Abnormal; Notable for the following components:      Result Value   Bilirubin Urine SMALL (*)    Ketones, ur >=160 (*)    Protein, ur 30 (*)    Urobilinogen, UA 2.0 (*)    All other components within normal limits    No Known Allergies  History reviewed. No pertinent past medical history. Social History   Socioeconomic History   Marital status: Single    Spouse name: Not on file   Number of children: Not on file   Years of education: Not on file   Highest education level: Not on file  Occupational History   Not on file  Tobacco Use   Smoking status: Never   Smokeless tobacco: Never  Vaping Use   Vaping Use: Never used  Substance and Sexual Activity   Alcohol use: No   Drug use: No   Sexual activity: Never    Birth control/protection: Injection  Other Topics  Concern   Not on file  Social History Narrative   Not on file   Social Determinants of Health   Financial Resource Strain: Not on file  Food Insecurity: Not on file  Transportation Needs: Not on file  Physical Activity: Not on file  Stress: Not on file  Social Connections: Not on file  Intimate Partner Violence: Not on file   Family History  Problem Relation Age of Onset   Diabetes type II Maternal Grandmother    Hypertension Maternal Dulce Sellar, MD 08/18/21 [phone]

## 2021-12-25 ENCOUNTER — Encounter (HOSPITAL_COMMUNITY): Payer: Self-pay | Admitting: Emergency Medicine

## 2021-12-25 ENCOUNTER — Emergency Department (HOSPITAL_COMMUNITY)
Admission: EM | Admit: 2021-12-25 | Discharge: 2021-12-25 | Disposition: A | Discharge status: Home / Self Care | Payer: Medicaid Other | Attending: Emergency Medicine | Admitting: Emergency Medicine

## 2021-12-25 DIAGNOSIS — N9489 Other specified conditions associated with female genital organs and menstrual cycle: Secondary | ICD-10-CM | POA: Insufficient documentation

## 2021-12-25 DIAGNOSIS — T40711A Poisoning by cannabis, accidental (unintentional), initial encounter: Secondary | ICD-10-CM | POA: Diagnosis present

## 2021-12-25 DIAGNOSIS — R4182 Altered mental status, unspecified: Secondary | ICD-10-CM | POA: Insufficient documentation

## 2021-12-25 DIAGNOSIS — T6591XA Toxic effect of unspecified substance, accidental (unintentional), initial encounter: Secondary | ICD-10-CM

## 2021-12-25 LAB — RAPID URINE DRUG SCREEN, HOSP PERFORMED
Amphetamines: NOT DETECTED
Barbiturates: NOT DETECTED
Benzodiazepines: NOT DETECTED
Cocaine: NOT DETECTED
Opiates: NOT DETECTED
Tetrahydrocannabinol: POSITIVE — AB

## 2021-12-25 LAB — CBC WITH DIFFERENTIAL/PLATELET
Abs Immature Granulocytes: 0.05 10*3/uL (ref 0.00–0.07)
Basophils Absolute: 0 10*3/uL (ref 0.0–0.1)
Basophils Relative: 0 %
Eosinophils Absolute: 0 10*3/uL (ref 0.0–0.5)
Eosinophils Relative: 0 %
HCT: 35.2 % — ABNORMAL LOW (ref 36.0–46.0)
Hemoglobin: 12.5 g/dL (ref 12.0–15.0)
Immature Granulocytes: 0 %
Lymphocytes Relative: 10 %
Lymphs Abs: 1.5 10*3/uL (ref 0.7–4.0)
MCH: 29 pg (ref 26.0–34.0)
MCHC: 35.5 g/dL (ref 30.0–36.0)
MCV: 81.7 fL (ref 80.0–100.0)
Monocytes Absolute: 0.8 10*3/uL (ref 0.1–1.0)
Monocytes Relative: 5 %
Neutro Abs: 12.3 10*3/uL — ABNORMAL HIGH (ref 1.7–7.7)
Neutrophils Relative %: 85 %
Platelets: 325 10*3/uL (ref 150–400)
RBC: 4.31 MIL/uL (ref 3.87–5.11)
RDW: 13.7 % (ref 11.5–15.5)
WBC: 14.6 10*3/uL — ABNORMAL HIGH (ref 4.0–10.5)
nRBC: 0 % (ref 0.0–0.2)

## 2021-12-25 LAB — ETHANOL: Alcohol, Ethyl (B): <10 mg/dL (ref <10)

## 2021-12-25 LAB — COMPREHENSIVE METABOLIC PANEL
ALT: 11 U/L (ref 0–44)
AST: 19 U/L (ref 15–41)
Albumin: 4.1 g/dL (ref 3.5–5.0)
Alkaline Phosphatase: 96 U/L (ref 38–126)
Anion gap: 8 (ref 5–15)
BUN: 6 mg/dL (ref 6–20)
CO2: 23 mmol/L (ref 22–32)
Calcium: 9.1 mg/dL (ref 8.9–10.3)
Chloride: 110 mmol/L (ref 98–111)
Creatinine, Ser: 0.68 mg/dL (ref 0.44–1.00)
GFR, Estimated: >60 mL/min (ref >60)
Glucose, Bld: 128 mg/dL — ABNORMAL HIGH (ref 70–99)
Potassium: 3.7 mmol/L (ref 3.5–5.1)
Sodium: 141 mmol/L (ref 135–145)
Total Bilirubin: 0.6 mg/dL (ref 0.3–1.2)
Total Protein: 7.5 g/dL (ref 6.5–8.1)

## 2021-12-25 LAB — I-STAT BETA HCG BLOOD, ED (MC, WL, AP ONLY): I-stat hCG, quantitative: <5 m[IU]/mL (ref <5)

## 2021-12-25 LAB — CK: Total CK: 59 U/L (ref 38–234)

## 2021-12-25 MED ORDER — LACTATED RINGERS IV SOLN: INTRAVENOUS | Status: DC

## 2021-12-25 MED ORDER — LACTATED RINGERS IV BOLUS
1000.0000 mL | Freq: Once | INTRAVENOUS | Status: AC
  Administered 2021-12-25: 1000 mL via INTRAVENOUS

## 2021-12-25 NOTE — ED Triage Notes (Signed)
Patient presents due to altered mental status post ingesting an edible at 0800. Family believes it was an 600 mg delta 8 gummy. Due to alteration, family called EMS. When they arrived, they noticed the patient had urinated on herself. When they attempted to change her, she began to yell at them and she became physically aggressive. Upon arrival, the patient was restrained. She might possibly be pregnant. Prior to arrival, EMS administered 5 mg of midazolam.    Hx asthma  EMS vitals: 140 HR 145/94 BP 185 CBG 91-95 % SPO2 on room air

## 2021-12-25 NOTE — ED Provider Notes (Signed)
[facility]-EMERG[facility] DEPT Provider Note   CSN: [id number] Arrival date & time: 12/25/21  1241     History  Chief Complaint  Patient presents with   Altered Mental Status    Dana Marquez is a 21 y.o. female.  21 year old female presents after ingesting 600 mg of THC in the edible.  According to EMS, patient became combative.  Patient altercation with her family.  Patient also urinated on herself.  Patient required physical restraints.  She was given Versed 5 mg IM for this.  Presents via EMS for further evaluation       Home Medications Prior to Admission medications   Medication Sig Start Date End Date Taking? Authorizing Provider  acetaminophen (TYLENOL) 500 MG tablet Take 1,000 mg by mouth every 6 (six) hours as needed for fever or mild pain.    [provider]  ibuprofen (ADVIL) 200 MG tablet Take 200 mg by mouth every 6 (six) hours as needed for fever or mild pain.    [provider]  ondansetron (ZOFRAN-ODT) 4 MG disintegrating tablet Take 1 tablet (4 mg total) by mouth every 8 (eight) hours as needed for nausea or vomiting. 08/16/21   Mardella Layman, MD  promethazine-dextromethorphan (PROMETHAZINE-DM) 6.25-15 MG/5ML syrup Take 5 mLs by mouth 4 (four) times daily as needed for cough. 08/16/21   Mardella Layman, MD      Allergies    Patient has no known allergies.    Review of Systems   Review of Systems  Unable to perform ROS: Acuity of condition    Physical Exam Updated Vital Signs BP 105/73 (BP Location: Left Arm)   Pulse (!) 145   Temp 99.3 F (37.4 C) (Oral)   Resp 18   LMP  (LMP Unknown)   SpO2 97%  Physical Exam Vitals and nursing note reviewed.  Constitutional:      General: She is not in acute distress.    Appearance: Normal appearance. She is well-developed. She is not toxic-appearing.  HENT:     Head: Normocephalic and atraumatic.  Eyes:     General: Lids are normal.     Conjunctiva/sclera: Conjunctivae  normal.     Pupils: Pupils are equal, round, and reactive to light.  Neck:     Thyroid: No thyroid mass.     Trachea: No tracheal deviation.  Cardiovascular:     Rate and Rhythm: Normal rate and regular rhythm.     Heart sounds: Normal heart sounds. No murmur heard.    No gallop.  Pulmonary:     Effort: Pulmonary effort is normal. No respiratory distress.     Breath sounds: Normal breath sounds. No stridor. No decreased breath sounds, wheezing, rhonchi or rales.  Abdominal:     General: There is no distension.     Palpations: Abdomen is soft.     Tenderness: There is no abdominal tenderness. There is no rebound.  Musculoskeletal:        General: No tenderness. Normal range of motion.     Cervical back: Normal range of motion and neck supple.  Skin:    General: Skin is warm and dry.     Findings: No abrasion or rash.  Neurological:     Mental Status: She is lethargic, disoriented and confused.     GCS: GCS eye subscore is 4. GCS verbal subscore is 4. GCS motor subscore is 5.     Cranial Nerves: No cranial nerve deficit.     Sensory: No sensory deficit.  Motor: No tremor.     Comments: Patient withdraws to pain in all 4 extremities.  Psychiatric:        Attention and Perception: Attention normal.        Speech: Speech normal.        Behavior: Behavior normal.     ED Results / Procedures / Treatments   Labs (all labs ordered are listed, but only abnormal results are displayed) Labs Reviewed  CBC WITH DIFFERENTIAL/PLATELET  COMPREHENSIVE METABOLIC PANEL  ETHANOL  RAPID URINE DRUG SCREEN, HOSP PERFORMED  CK  I-STAT BETA HCG BLOOD, ED ([facility], [facility], [facility] ONLY)    EKG None  Radiology No results found.  Procedures Procedures    Medications Ordered in ED Medications  lactated ringers bolus 1,000 mL (has no administration in time range)  lactated ringers infusion (has no administration in time range)    ED Course/ Medical Decision Making/ A&P                            Medical Decision Making Amount and/or Complexity of Data Reviewed Labs: ordered.  Risk Prescription drug management.   Patient was physically restrained for her safety.  Patient monitored here and she is not back to her baseline.  Both parents are at bedside and confirmed this.  Labs are reassuring here.  No evidence of rhabdomyolysis.  Suspect that patient had an accidental overdose of THC.  Patient strongly encouraged to avoid all products containing THC.  Return precautions given        Final Clinical Impression(s) / ED Diagnoses Final diagnoses:  None    Rx / DC Orders ED Discharge Orders     None         Lorre Nick, MD 12/25/21 1523

## 2021-12-25 NOTE — Discharge Instructions (Signed)
Avoid all substances that contain THC.  Drink plenty of liquids.  Follow-up with your doctor as needed

## 2023-01-21 ENCOUNTER — Ambulatory Visit: Payer: Medicaid Other | Admitting: Obstetrics and Gynecology

## 2023-01-21 ENCOUNTER — Encounter: Payer: Self-pay | Admitting: Obstetrics and Gynecology

## 2023-01-21 ENCOUNTER — Other Ambulatory Visit (HOSPITAL_COMMUNITY)
Admission: RE | Admit: 2023-01-21 | Discharge: 2023-01-21 | Disposition: A | Discharge status: Home / Self Care | Payer: Medicaid Other | Source: Ambulatory Visit | Attending: Obstetrics and Gynecology | Admitting: Obstetrics and Gynecology

## 2023-01-21 VITALS — BP 125/79 | HR 56 | Ht 63.0 in | Wt 115.0 lb

## 2023-01-21 DIAGNOSIS — Z01419 Encounter for gynecological examination (general) (routine) without abnormal findings: Secondary | ICD-10-CM

## 2023-01-21 DIAGNOSIS — N920 Excessive and frequent menstruation with regular cycle: Secondary | ICD-10-CM

## 2023-01-21 DIAGNOSIS — Z113 Encounter for screening for infections with a predominantly sexual mode of transmission: Secondary | ICD-10-CM | POA: Insufficient documentation

## 2023-01-21 MED ORDER — NORETHIN ACE-ETH ESTRAD-FE 1-20 MG-MCG(24) PO TABS
1.0000 | ORAL_TABLET | Freq: Every day | ORAL | 11 refills | Status: DC
Start: 2023-01-21 — End: 2023-11-11

## 2023-01-21 NOTE — Progress Notes (Signed)
Pt in office for AEX, on cycle now- heavy flow. Pt states the last few months cycles have been heavier and longer - lasting up to 12 days. Pt complains of cramping the entire cycle, also passing mod size clots.   Pt has no BC currently.

## 2023-01-21 NOTE — Progress Notes (Signed)
GYNECOLOGY ANNUAL PREVENTATIVE CARE ENCOUNTER NOTE  History:     Dana Marquez is a 22 y.o. G0P0000 female here for a routine annual gynecologic exam.  Current complaints: prolonged menses with clots.   Denies discharge, pelvic pain, problems with intercourse or other gynecologic concerns.   The last 3 months the patient has had menses lasting 10-12 days.  Pt denies any unprotected intercourse.  Pt has tried no interventions.   Gynecologic History Patient's last menstrual period was 01/12/2023. Contraception: none Last Pap: n/a.   Obstetric History OB History  Gravida Para Term Preterm AB Living  0 0 0 0 0 0  SAB IAB Ectopic Multiple Live Births  0 0 0 0 0    History reviewed. No pertinent past medical history.  Past Surgical History:  Procedure Laterality Date   TONSILLECTOMY  2012    Current Outpatient Medications on File Prior to Visit  Medication Sig Dispense Refill   acetaminophen (TYLENOL) 500 MG tablet Take 1,000 mg by mouth every 6 (six) hours as needed for fever or mild pain. (Patient not taking: Reported on 01/21/2023)     ibuprofen (ADVIL) 200 MG tablet Take 200 mg by mouth every 6 (six) hours as needed for fever or mild pain. (Patient not taking: Reported on 01/21/2023)     ondansetron (ZOFRAN-ODT) 4 MG disintegrating tablet Take 1 tablet (4 mg total) by mouth every 8 (eight) hours as needed for nausea or vomiting. (Patient not taking: Reported on 01/21/2023) 15 tablet 0   promethazine-dextromethorphan (PROMETHAZINE-DM) 6.25-15 MG/5ML syrup Take 5 mLs by mouth 4 (four) times daily as needed for cough. (Patient not taking: Reported on 01/21/2023) 118 mL 0   No current facility-administered medications on file prior to visit.    No Known Allergies  Social History:  reports that she has never smoked. She has never used smokeless tobacco. She reports that she does not drink alcohol and does not use drugs.  Family History  Problem Relation Age of Onset   Diabetes  type II Maternal Grandmother    Hypertension Maternal Grandmother     The following portions of the patient's history were reviewed and updated as appropriate: allergies, current medications, past family history, past medical history, past social history, past surgical history and problem list.  Review of Systems Pertinent items noted in HPI and remainder of comprehensive ROS otherwise negative.  Physical Exam:  BP 125/79   Pulse (!) 56   Ht 5\' 3"  (1.6 m)   Wt 115 lb (52.2 kg)   LMP 01/12/2023   BMI 20.37 kg/m  CONSTITUTIONAL: Well-developed, well-nourished female in no acute distress.  HENT:  Normocephalic, atraumatic, External right and left ear normal. Oropharynx is clear and moist EYES: Conjunctivae and EOM are normal.  NECK: Normal range of motion, supple, no masses.  Normal thyroid.  SKIN: Skin is warm and dry. No rash noted. Not diaphoretic. No erythema. No pallor. MUSCULOSKELETAL: Normal range of motion. No tenderness.  No cyanosis, clubbing, or edema.  2+ distal pulses. NEUROLOGIC: Alert and oriented to person, place, and time. Normal reflexes, muscle tone coordination.  PSYCHIATRIC: Normal mood and affect. Normal behavior. Normal judgment and thought content. CARDIOVASCULAR: Normal heart rate noted, regular rhythm RESPIRATORY: Clear to auscultation bilaterally. Effort and breath sounds normal, no problems with respiration noted. BREASTS: Symmetric in size. No masses, tenderness, skin changes, nipple drainage, or lymphadenopathy bilaterally. Performed in the presence of a chaperone. ABDOMEN: Soft, no distention noted.  No tenderness, rebound or guarding.  PELVIC: Normal appearing external genitalia and urethral meatus; normal appearing vaginal mucosa and cervix.  No abnormal discharge noted.  Pap smear obtained. Vaginal swab taken.  Normal uterine size, no other palpable masses, no uterine or adnexal tenderness.  Performed in the presence of a chaperone.   Assessment and Plan:     1. Women's annual routine gynecological examination Normal annual exam  - Cytology - PAP( [facility])  2. Menorrhagia with regular cycle Will check pregnancy test and von willebrand panel. Trial of OCPs 3-4 months for cycle control.  If ineffective, will get pelvic ultrasound. - Beta hCG quant (ref lab) - Von Willebrand panel - Norethindrone Acetate-Ethinyl Estrad-FE (LOESTRIN 24 FE) 1-20 MG-MCG(24) tablet; Take 1 tablet by mouth daily.  Dispense: 28 tablet; Refill: 11  3. Routine screening for STI (sexually transmitted infection) Per pt request  - Cervicovaginal ancillary only - Hepatitis B surface antigen - Hepatitis C antibody - RPR - HIV Antibody (routine testing w rflx)  Will follow up results of pap smear and manage accordingly. Routine preventative health maintenance measures emphasized. Please refer to After Visit Summary for other counseling recommendations.      Mariel Aloe, MD, FACOG Obstetrician & Gynecologist, [facility] for [facility], [facility]

## 2023-01-23 LAB — VON WILLEBRAND PANEL
Factor VIII Activity: 98 % (ref 56–140)
Von Willebrand Ag: 94 % (ref 50–200)
Von Willebrand Factor: 60 % (ref 50–200)

## 2023-01-23 LAB — BETA HCG QUANT (REF LAB): hCG Quant: <1 m[IU]/mL

## 2023-01-23 LAB — HIV ANTIBODY (ROUTINE TESTING W REFLEX): HIV Screen 4th Generation wRfx: NONREACTIVE

## 2023-01-23 LAB — SYPHILIS: RPR W/REFLEX TO RPR TITER AND TREPONEMAL ANTIBODIES, TRADITIONAL SCREENING AND DIAGNOSIS ALGORITHM: RPR Ser Ql: NONREACTIVE

## 2023-02-25 ENCOUNTER — Ambulatory Visit: Payer: Medicaid Other | Admitting: Obstetrics and Gynecology

## 2023-03-08 ENCOUNTER — Ambulatory Visit (INDEPENDENT_AMBULATORY_CARE_PROVIDER_SITE_OTHER): Payer: Medicaid Other | Admitting: Obstetrics and Gynecology

## 2023-03-08 VITALS — BP 116/79 | HR 83 | Ht 63.0 in | Wt 115.1 lb

## 2023-03-08 DIAGNOSIS — N920 Excessive and frequent menstruation with regular cycle: Secondary | ICD-10-CM | POA: Diagnosis not present

## 2023-03-08 DIAGNOSIS — N926 Irregular menstruation, unspecified: Secondary | ICD-10-CM | POA: Insufficient documentation

## 2023-03-08 NOTE — Progress Notes (Signed)
Pt presents to f/u about heavy periods. Taking OCPs daily as prescribed. Has noticed that periods are lighter, but still cramping.

## 2023-03-08 NOTE — Progress Notes (Signed)
OCP follow up Subjective:    Patient ID: Dana Marquez, female    DOB: March 02, 2001, 22 y.o.   MRN: [id number]  HPI 22 yo G0 seen for follow up of OCP use for menorrhagia.  Previous labs were negative for von Willebrands disease.  Pt states her menses were definitely lighter, but she continued to have spotting through both OCP packs   Review of Systems     Objective:   Physical Exam Vitals:   03/08/23 1528  BP: 116/79  Pulse: 83         Assessment & Plan:   1. Menorrhagia with regular cycle Menstrual bleeding has improved but she still has breakthrough bleeding on the pill.  Continue OCP for 3 months.  If breakthrough bleeding continues or worsens will need to get pelvic ultrasound to eval for any anatomical issues.  I spent 10 minutes dedicated to the care of this patient including previsit review of records, face to face time with the patient discussing treatment options and post visit testing.   Warden Fillers, MD Faculty Attending, Center for [facility]

## 2023-11-11 ENCOUNTER — Other Ambulatory Visit: Payer: Self-pay

## 2023-11-11 DIAGNOSIS — N920 Excessive and frequent menstruation with regular cycle: Secondary | ICD-10-CM

## 2023-11-11 MED ORDER — NORETHIN ACE-ETH ESTRAD-FE 1-20 MG-MCG(24) PO TABS: 1.0000 | ORAL_TABLET | Freq: Every day | ORAL | 3 refills | Status: DC

## 2023-11-11 MED ORDER — NORETHIN ACE-ETH ESTRAD-FE 1-20 MG-MCG(24) PO TABS: 1.0000 | ORAL_TABLET | Freq: Every day | ORAL | 2 refills | Status: DC

## 2024-03-10 ENCOUNTER — Other Ambulatory Visit: Payer: Self-pay

## 2024-03-10 DIAGNOSIS — N920 Excessive and frequent menstruation with regular cycle: Secondary | ICD-10-CM

## 2024-03-10 MED ORDER — NORETHIN ACE-ETH ESTRAD-FE 1-20 MG-MCG(24) PO TABS: 1.0000 | ORAL_TABLET | Freq: Every day | ORAL | 3 refills | Status: DC

## 2024-03-22 ENCOUNTER — Ambulatory Visit: Admitting: Obstetrics and Gynecology

## 2024-03-22 ENCOUNTER — Other Ambulatory Visit (HOSPITAL_COMMUNITY)
Admission: RE | Admit: 2024-03-22 | Discharge: 2024-03-22 | Disposition: A | Discharge status: Home / Self Care | Source: Ambulatory Visit | Attending: Obstetrics and Gynecology | Admitting: Obstetrics and Gynecology

## 2024-03-22 ENCOUNTER — Encounter: Payer: Self-pay | Admitting: Obstetrics and Gynecology

## 2024-03-22 VITALS — BP 112/70 | HR 87 | Ht 63.0 in | Wt 123.0 lb

## 2024-03-22 DIAGNOSIS — N920 Excessive and frequent menstruation with regular cycle: Secondary | ICD-10-CM

## 2024-03-22 DIAGNOSIS — Z01419 Encounter for gynecological examination (general) (routine) without abnormal findings: Secondary | ICD-10-CM

## 2024-03-22 MED ORDER — NORETHIN ACE-ETH ESTRAD-FE 1-20 MG-MCG(24) PO TABS: 1.0000 | ORAL_TABLET | Freq: Every day | ORAL | 3 refills | Status: DC

## 2024-03-22 MED ORDER — NORETHIN ACE-ETH ESTRAD-FE 1-20 MG-MCG(24) PO TABS: 1.0000 | ORAL_TABLET | Freq: Every day | ORAL | 11 refills | Status: AC

## 2024-03-22 NOTE — Progress Notes (Signed)
 23 y.o. GYN presents for AEX/PAP/STD screening.

## 2024-03-22 NOTE — Progress Notes (Signed)
 Subjective:     Dana Marquez is a 23 y.o. female P0 with LMP 03/17/24 and BMI 21 who is here for a comprehensive physical exam. The patient reports no problems. She reports a monthly period lasting 3-4 days. She is sexually active using COC for contraception without complaints. She denies pelvic pain or abnormal discharge. Patient is without any complaints  No past medical history on file. Past Surgical History:  Procedure Laterality Date   TONSILLECTOMY  2012   Family History  Problem Relation Age of Onset   Diabetes type II Maternal Grandmother    Hypertension Maternal Grandmother     Social History   Socioeconomic History   Marital status: Single    Spouse name: Not on file   Number of children: Not on file   Years of education: Not on file   Highest education level: Not on file  Occupational History   Not on file  Tobacco Use   Smoking status: Never   Smokeless tobacco: Never  Vaping Use   Vaping status: Never Used  Substance and Sexual Activity   Alcohol use: Yes   Drug use: No   Sexual activity: Not Currently    Birth control/protection: OCP  Other Topics Concern   Not on file  Social History Narrative   Not on file   Social Drivers of Health   Financial Resource Strain: Not on File (12/12/2021)   Received from [employer]    Financial Resource Strain: 0  Food Insecurity: At Risk (02/23/2023)   Received from [facility] Insecurity    Within the past 12 months, the food you bought just didn't last and you didn't have enough money to get more.: 2  Transportation Needs: Not at Risk (02/23/2023)   Received from [employer] Needs    In the past 12 months, has lack of transportation kept you from medical appointments, meetings, work or from getting things needed for daily living? (Check all that apply): 1  Physical Activity: Not on File (12/12/2021)   Received from [facility]   Physical Activity    Physical Activity: 0  Stress: Not  on File (12/12/2021)   Received from [facility]   Stress    Stress: 0  Social Connections: Not on File (02/23/2023)   Received from [facility]   Social Connections    Connectedness: 0  Intimate Partner Violence: Not on file   Health Maintenance  Topic Date Due   HPV VACCINES (1 - 3-dose series) Never done   Meningococcal B Vaccine (1 of 2 - Standard) Never done   DTaP/Tdap/Td (1 - Tdap) Never done   Influenza Vaccine  01/21/2024   COVID-19 Vaccine (2 - 2025-26 season) 02/21/2024   Hepatitis B Vaccines 19-59 Average Risk  Completed   Hepatitis C Screening  Completed   HIV Screening  Completed   Pneumococcal Vaccine  Aged Out       Review of Systems Pertinent items noted in HPI and remainder of comprehensive ROS otherwise negative.   Objective:  Blood pressure 112/70, pulse 87, height 5' 3 (1.6 m), weight 123 lb (55.8 kg), last menstrual period 03/17/2024.   GENERAL: Well-developed, well-nourished female in no acute distress.  HEENT: Normocephalic, atraumatic. Sclerae anicteric.  NECK: Supple. Normal thyroid.  LUNGS: Clear to auscultation bilaterally.  HEART: Regular rate and rhythm. BREASTS: Symmetric in size. No palpable masses or lymphadenopathy, skin changes, or nipple drainage. ABDOMEN: Soft, nontender, nondistended. No organomegaly. PELVIC: Normal external female  genitalia. Vagina is pink and rugated.  Normal discharge. Normal appearing cervix. Uterus is normal in size. No adnexal mass or tenderness. Chaperone present during the pelvic exam EXTREMITIES: No cyanosis, clubbing, or edema, 2+ distal pulses.     Assessment:    Healthy female exam.      Plan:    Pap smear collected STI screening obtained per patient request Patient will be contacted with abnormal results Refill on Loestrin provided See After Visit Summary for Counseling Recommendations

## 2024-03-23 LAB — CERVICOVAGINAL ANCILLARY ONLY
Chlamydia: NEGATIVE
Comment: NEGATIVE
Comment: NORMAL
Neisseria Gonorrhea: NEGATIVE

## 2024-03-23 LAB — HIV ANTIBODY (ROUTINE TESTING W REFLEX): HIV Screen 4th Generation wRfx: NONREACTIVE

## 2024-03-23 LAB — HEPATITIS C ANTIBODY: Hep C Virus Ab: NONREACTIVE

## 2024-03-23 LAB — RPR: RPR Ser Ql: NONREACTIVE

## 2024-03-23 LAB — HEPATITIS B SURFACE ANTIGEN: Hepatitis B Surface Ag: NEGATIVE

## 2024-03-24 LAB — CYTOLOGY - PAP
Diagnosis: NEGATIVE
Diagnosis: REACTIVE

## 2024-06-27 ENCOUNTER — Ambulatory Visit
Admission: EM | Admit: 2024-06-27 | Discharge: 2024-06-27 | Disposition: A | Discharge status: Home / Self Care | Attending: Family Medicine | Admitting: Family Medicine

## 2024-06-27 ENCOUNTER — Encounter: Payer: Self-pay | Admitting: Emergency Medicine

## 2024-06-27 DIAGNOSIS — M62838 Other muscle spasm: Secondary | ICD-10-CM

## 2024-06-27 MED ORDER — NAPROXEN 500 MG PO TABS: 500.0000 mg | ORAL_TABLET | Freq: Two times a day (BID) | ORAL | 0 refills | Status: AC | PRN

## 2024-06-27 MED ORDER — METHOCARBAMOL 750 MG PO TABS: 750.0000 mg | ORAL_TABLET | Freq: Two times a day (BID) | ORAL | 0 refills | Status: AC | PRN

## 2024-06-27 NOTE — ED Triage Notes (Signed)
 Pt reports when woke up yesterday having pain on left neck that radiates down back and left arm. Took ibuprofen .

## 2024-06-27 NOTE — ED Provider Notes (Signed)
 " [facility]    CSN: [id number] Arrival date & time: 06/27/24  0954      History   Chief Complaint Chief Complaint  Patient presents with   Neck Pain    HPI Dana Marquez is a 24 y.o. female presents for neck and shoulder pain.  Patient reports yesterday she developed some pain/spasm/tenderness to the left aspect of her neck that radiates into her left trapezius and down to her left shoulder blade.  Pain is worse with movement or rotation of her head.  Denies any injury or known inciting event.  No numbness tingling weakness of her upper extremities.  No history of surgeries or injuries to the area in the past.  No headaches.  Has taken ibuprofen  last dose 600 mg at 6 AM today.  No other concerns at this time.   Neck Pain   History reviewed. No pertinent past medical history.  Patient Active Problem List   Diagnosis Date Noted   Menorrhagia with regular cycle 03/08/2023    Past Surgical History:  Procedure Laterality Date   TONSILLECTOMY  2012    OB History     Gravida  0   Para  0   Term  0   Preterm  0   AB  0   Living  0      SAB  0   IAB  0   Ectopic  0   Multiple  0   Live Births  0            Home Medications    Prior to Admission medications  Medication Sig Start Date End Date Taking? Authorizing Provider  methocarbamol  (ROBAXIN ) 750 MG tablet Take 1 tablet (750 mg total) by mouth 2 (two) times daily as needed for muscle spasms. 06/27/24  Yes Isayah Ignasiak, Jodi R, NP  naproxen  (NAPROSYN ) 500 MG tablet Take 1 tablet (500 mg total) by mouth 2 (two) times daily as needed. 06/27/24  Yes Liam Bossman, Jodi R, NP  Norethindrone Acetate-Ethinyl Estrad-FE (LOESTRIN 24 FE) 1-20 MG-MCG(24) tablet Take 1 tablet by mouth daily. 03/22/24   Constant, Peggy, MD    Family History Family History  Problem Relation Age of Onset   Diabetes type II Maternal Grandmother    Hypertension Maternal Grandmother     Social History Social  History[1]   Allergies   Patient has no known allergies.   Review of Systems Review of Systems  Musculoskeletal:  Positive for neck pain.     Physical Exam Triage Vital Signs ED Triage Vitals  Encounter Vitals Group     BP 06/27/24 1025 116/73     Girls Systolic BP Percentile --      Girls Diastolic BP Percentile --      Boys Systolic BP Percentile --      Boys Diastolic BP Percentile --      Pulse Rate 06/27/24 1025 79     Resp 06/27/24 1025 15     Temp 06/27/24 1025 99.1 F (37.3 C)     Temp Source 06/27/24 1025 Oral     SpO2 06/27/24 1025 97 %     Weight --      Height --      Head Circumference --      Peak Flow --      Pain Score 06/27/24 1024 8     Pain Loc --      Pain Education --      Exclude from Growth Chart --  No data found.  Updated Vital Signs BP 116/73 (BP Location: Right Arm)   Pulse 79   Temp 99.1 F (37.3 C) (Oral)   Resp 15   LMP 06/01/2024 (Exact Date)   SpO2 97%   Visual Acuity Right Eye Distance:   Left Eye Distance:   Bilateral Distance:    Right Eye Near:   Left Eye Near:    Bilateral Near:     Physical Exam Vitals and nursing note reviewed.  Constitutional:      General: She is not in acute distress.    Appearance: Normal appearance. She is not ill-appearing.  HENT:     Head: Normocephalic and atraumatic.  Eyes:     Pupils: Pupils are equal, round, and reactive to light.  Neck:      Comments: There is no swelling or ecchymosis of the neck.  Tender to palpation to the left paracervical muscles that extends to the left trapezius and down the left rhomboid.  Pain with left-sided rotation and flexion of the neck.  Strength is 5 out of 5 bilateral upper extremities. Cardiovascular:     Rate and Rhythm: Normal rate.  Pulmonary:     Effort: Pulmonary effort is normal.  Musculoskeletal:     Cervical back: Normal range of motion and neck supple. No edema, erythema, signs of trauma, rigidity, torticollis or crepitus. Pain  with movement and muscular tenderness present. No spinous process tenderness. Normal range of motion.  Skin:    General: Skin is warm and dry.  Neurological:     General: No focal deficit present.     Mental Status: She is alert and oriented to person, place, and time.  Psychiatric:        Mood and Affect: Mood normal.        Behavior: Behavior normal.      UC Treatments / Results  Labs (all labs ordered are listed, but only abnormal results are displayed) Labs Reviewed - No data to display  EKG   Radiology No results found.  Procedures Procedures (including critical care time)  Medications Ordered in UC Medications - No data to display  Initial Impression / Assessment and Plan / UC Course  I have reviewed the triage vital signs and the nursing notes.  Pertinent labs & imaging results that were available during my care of the patient were reviewed by me and considered in my medical decision making (see chart for details).  Clinical Course as of 06/27/24 1044  Tue Jun 27, 2024  1043 Oral temperature recheck 98.2 [JM]    Clinical Course User Index [JM] Loreda Myla SAUNDERS, NP    Reviewed exam and symptoms with patient.  No red flags.  Discussed muscle spasm.  No Toradol as she recently took ibuprofen .  Will do trial of Robaxin  and naproxen  as needed.  Advised heat to the area at rest and PCP follow-up 2 to 3 days for recheck.  ER precautions reviewed. Final Clinical Impressions(s) / UC Diagnoses   Final diagnoses:  Trapezius muscle spasm  Muscle spasms of neck     Discharge Instructions      You may start Robaxin  muscle relaxer twice daily as needed.  Please note this will make you drowsy.  Do not drink alcohol or drive on this medication.  Start naproxen  twice daily as needed as well for muscle pain.  Take 1 dose today as you have already taken ibuprofen  this morning and starting tomorrow you may take it twice daily.  Heat  to the neck and shoulder and lots of rest.   Follow-up with your PCP in 2 to 3 days for recheck.  Please go to the emergency room for any worsening symptoms.  Hope you feel better soon!    ED Prescriptions     Medication Sig Dispense Auth. Provider   methocarbamol  (ROBAXIN ) 750 MG tablet Take 1 tablet (750 mg total) by mouth 2 (two) times daily as needed for muscle spasms. 10 tablet Ashawn Rinehart, Jodi R, NP   naproxen  (NAPROSYN ) 500 MG tablet Take 1 tablet (500 mg total) by mouth 2 (two) times daily as needed. 14 tablet Henderson Frampton, Jodi R, NP      PDMP not reviewed this encounter.    [1]  Social History Tobacco Use   Smoking status: Never   Smokeless tobacco: Never  Vaping Use   Vaping status: Never Used  Substance Use Topics   Alcohol use: Yes   Drug use: No     Loreda Myla SAUNDERS, NP 06/27/24 1044  "

## 2024-06-27 NOTE — Discharge Instructions (Signed)
 You may start Robaxin  muscle relaxer twice daily as needed.  Please note this will make you drowsy.  Do not drink alcohol or drive on this medication.  Start naproxen  twice daily as needed as well for muscle pain.  Take 1 dose today as you have already taken ibuprofen  this morning and starting tomorrow you may take it twice daily.  Heat to the neck and shoulder and lots of rest.  Follow-up with your PCP in 2 to 3 days for recheck.  Please go to the emergency room for any worsening symptoms.  Hope you feel better soon!
